# Patient Record
Sex: Male | Born: 1985 | Race: White | Hispanic: No | Marital: Single | Smoking: Former smoker
Health system: Southern US, Community
[De-identification: ages and names within clinical notes are randomized; demographics above are authoritative.]

## PROBLEM LIST (undated history)

## (undated) DIAGNOSIS — I10 Essential (primary) hypertension: Secondary | ICD-10-CM

## (undated) DIAGNOSIS — I509 Heart failure, unspecified: Secondary | ICD-10-CM

## (undated) DIAGNOSIS — R002 Palpitations: Secondary | ICD-10-CM

## (undated) HISTORY — PX: APPENDECTOMY: SHX54

---

## 2021-06-27 ENCOUNTER — Other Ambulatory Visit: Payer: Self-pay

## 2021-06-27 ENCOUNTER — Emergency Department
Admission: EM | Admit: 2021-06-27 | Discharge: 2021-06-27 | Disposition: A | Discharge status: Home / Self Care | Payer: BC Managed Care – PPO | Source: Home / Self Care | Attending: Family Medicine | Admitting: Family Medicine

## 2021-06-27 ENCOUNTER — Emergency Department (INDEPENDENT_AMBULATORY_CARE_PROVIDER_SITE_OTHER): Payer: BC Managed Care – PPO

## 2021-06-27 DIAGNOSIS — M25512 Pain in left shoulder: Secondary | ICD-10-CM | POA: Diagnosis not present

## 2021-06-27 DIAGNOSIS — Y93B3 Activity, free weights: Secondary | ICD-10-CM

## 2021-06-27 DIAGNOSIS — M792 Neuralgia and neuritis, unspecified: Secondary | ICD-10-CM

## 2021-06-27 HISTORY — DX: Essential (primary) hypertension: I10

## 2021-06-27 HISTORY — DX: Palpitations: R00.2

## 2021-06-27 HISTORY — DX: Heart failure, unspecified: I50.9

## 2021-06-27 MED ORDER — CYCLOBENZAPRINE HCL 10 MG PO TABS: 10.0000 mg | ORAL_TABLET | Freq: Every day | ORAL | 0 refills | Status: AC

## 2021-06-27 MED ORDER — PREDNISONE 20 MG PO TABS: ORAL_TABLET | ORAL | 0 refills | Status: AC

## 2021-06-27 NOTE — Discharge Instructions (Addendum)
Apply ice pack for 20 to 30 minutes, 3 to 4 times daily  Continue until pain decreases.  May take 2 extra strength Tylenol tabs once or twice daily if needed for pain. Avoid heavy lifting.

## 2021-06-27 NOTE — ED Provider Notes (Signed)
[name]    CSN: [id number] Arrival date & time: 06/27/21  1451      History   Chief Complaint Chief Complaint  Patient presents with   Back Pain    Between shoulder blades   Numbness    Left arm    HPI Donald Mann is a 35 y.o. male.   Patient states that he was lifting weights in the gym about 1.5 hours ago.  While doing "curls" with his left arm using a 10 pound dumb-bell, he suddenly experienced numbness/tingling in his left arm and pain around his left shoulder and left scapula.  The sensation of tingling has decreased but he still feels weakness in his left arm. One week ago while doing cable curls with his left arm, with his left arm positioned horizontally to his left, he suddenly experienced pain in his left trapezius and left scapular area that resolved.   He denies chest pain, shortness of breath, diaphoresis. Patient recalls that about one year ago while doing "shoulder shrugs" using dumb-bells in both arms, he suddenly felt a "popping" pain in his posterior neck that lasted about 3 weeks before resolving.  The history is provided by the patient.  Back Pain Pain location: upper back. Radiates to:  L shoulder Pain severity:  Moderate Pain is:  Same all the time Onset quality:  Sudden Duration:  2 hours Timing:  Constant Progression:  Improving Chronicity:  Recurrent Context comment:  Lifting weights Relieved by:  Nothing Exacerbated by: left arm movement. Ineffective treatments: Excedrin. Associated symptoms: numbness, paresthesias, tingling and weakness   Associated symptoms: no chest pain and no fever    Past Medical History:  Diagnosis Date   Heart failure (HCC)    Hypertension    Palpitations     There are no problems to display for this patient.   Past Surgical History:  Procedure Laterality Date   APPENDECTOMY         Home Medications    Prior to Admission medications   Medication Sig Start Date End Date Taking?  Authorizing Provider  cyclobenzaprine (FLEXERIL) 10 MG tablet Take 1 tablet (10 mg total) by mouth at bedtime. 06/27/21  Yes Lattie Haw, MD  predniSONE (DELTASONE) 20 MG tablet Take one tab by mouth twice daily for 4 days, then one daily. Take with food. 06/27/21  Yes Lattie Haw, MD  metoprolol tartrate (LOPRESSOR) 25 MG tablet Take by mouth. 01/09/19   [provider]    Family History Family History  Problem Relation Age of Onset   Anemia Mother    Hypotension Mother    Healthy Father     Social History Social History   Tobacco Use   Smoking status: Former    Years: 3.00    Pack years: 0.00    Types: Cigarettes    Quit date: 2021    Years since quitting: 1.5   Smokeless tobacco: Never  Vaping Use   Vaping Use: Never used  Substance Use Topics   Alcohol use: Not Currently   Drug use: Not Currently     Allergies   Patient has no known allergies.   Review of Systems Review of Systems  Constitutional:  Negative for diaphoresis and fever.  Respiratory:  Negative for cough, chest tightness and shortness of breath.   Cardiovascular:  Negative for chest pain and palpitations.  Musculoskeletal:  Positive for back pain and neck pain. Negative for joint swelling and neck stiffness.  Skin:  Negative for  rash.  Neurological:  Positive for tingling, weakness, numbness and paresthesias.  All other systems reviewed and are negative.   Physical Exam Triage Vital Signs ED Triage Vitals  Enc Vitals Group     BP 06/27/21 1503 (!) 137/97     Pulse Rate 06/27/21 1503 88     Resp 06/27/21 1503 20     Temp 06/27/21 1503 98.6 F (37 C)     Temp Source 06/27/21 1503 Oral     SpO2 06/27/21 1503 97 %     Weight 06/27/21 1506 223 lb (101.2 kg)     Height 06/27/21 1506 5\' 8"  (1.727 m)     Head Circumference --      Peak Flow --      Pain Score 06/27/21 1505 2     Pain Loc --      Pain Edu? --      Excl. in GC? --    No data found.  Updated Vital Signs BP (!)  147/77 (BP Location: Right Arm)   Pulse 88   Temp 98.6 F (37 C) (Oral)   Resp 20   Ht 5\' 8"  (1.727 m)   Wt 101.2 kg   SpO2 97%   BMI 33.91 kg/m   Visual Acuity Right Eye Distance:   Left Eye Distance:   Bilateral Distance:    Right Eye Near:   Left Eye Near:    Bilateral Near:     Physical Exam Vitals and nursing note reviewed.  Constitutional:      General: He is not in acute distress. HENT:     Head: Normocephalic.     Mouth/Throat:     Pharynx: Oropharynx is clear.  Eyes:     Conjunctiva/sclera: Conjunctivae normal.     Pupils: Pupils are equal, round, and reactive to light.  Neck:     Comments: When rotating head to the right, and flexing his neck laterally to the right, patient experiences pain in his left trapezius area.  When he flexes his neck forward, he experiences pain in his upper midline back.  There is mild tenderness to palpation over the left trapezius muscle. Cardiovascular:     Rate and Rhythm: Normal rate and regular rhythm.     Heart sounds: Normal heart sounds.  Pulmonary:     Breath sounds: Normal breath sounds.  Abdominal:     Palpations: Abdomen is soft.     Tenderness: There is no abdominal tenderness.  Musculoskeletal:        General: No swelling or deformity.     Left shoulder: No tenderness. Normal range of motion.     Cervical back: Normal range of motion. No tenderness.     Comments: Left shoulder:  negative Apley's test.  There is decreased grip strength left hand and decreased sensation also.  Lymphadenopathy:     Cervical: No cervical adenopathy.  Skin:    General: Skin is warm and dry.     Findings: No rash.  Neurological:     Mental Status: He is alert and oriented to person, place, and time.     UC Treatments / Results  Labs (all labs ordered are listed, but only abnormal results are displayed) Labs Reviewed - No data to display  EKG  Rate:  73 BPM PR:  142 msec  QT:  350 msec QTcH:  385 msec QRSD:  90 msec QRS  axis:  6 degrees Interpretation:  Within normal limits; normal sinus rhythm    Radiology DG  Cervical Spine Complete  Result Date: 06/27/2021 CLINICAL DATA:  Left shoulder and scapular pain while lifting weights. EXAM: CERVICAL SPINE - COMPLETE 4+ VIEW COMPARISON:  Neck CT 02/10/2009, report only. FINDINGS: The lateral view images through the bottom of C7. Prevertebral soft tissues are within normal limits. Mild straightening and reversal of expected cervical lordosis. Maintenance of vertebral body height. Mild left-sided neural foraminal narrowing at C3-4 and C4-5 secondary to facet arthropathy. A Don toy process and lateral masses intact. C7/T1 junction grossly within normal limits. IMPRESSION: Straightening and mild reversal expected cervical lordosis, possibly due to muscular spasm. Mild left-sided neural foraminal narrowing as detailed above. Electronically Signed   By: Jeronimo Greaves M.D.   On: 06/27/2021 17:24    Procedures Procedures (including critical care time)  Medications Ordered in UC Medications - No data to display  Initial Impression / Assessment and Plan / UC Course  I have reviewed the triage vital signs and the nursing notes.  Pertinent labs & imaging results that were available during my care of the patient were reviewed by me and considered in my medical decision making (see chart for details).    Normal EKG reassuring. Begin prednisone burst/taper.  Rx for Flexeril 10mg  HS. Followup with Dr[facility] ([facility] in about one week for further evaluation.  Final Clinical Impressions(s) / UC Diagnoses   Final diagnoses:  Radicular pain in left arm     Discharge Instructions      Apply ice pack for 20 to 30 minutes, 3 to 4 times daily  Continue until pain decreases.  May take 2 extra strength Tylenol tabs once or twice daily if needed for pain. Avoid heavy lifting.     ED Prescriptions     Medication Sig Dispense Auth. Provider    predniSONE (DELTASONE) 20 MG tablet Take one tab by mouth twice daily for 4 days, then one daily. Take with food. 12 tablet Donald Langton, MD   cyclobenzaprine (FLEXERIL) 10 MG tablet Take 1 tablet (10 mg total) by mouth at bedtime. 10 tablet Lattie Haw, MD         Lattie Haw, MD 07/01/21 1053

## 2021-06-27 NOTE — ED Triage Notes (Addendum)
Pt presents to Urgent Care with c/o onset of pain to upper back between shoulder blades and L arm numbness approx 1.5 hours ago while at the gym. Reports he was lifting weights when this occurred, and he had a similar incident of pain in L shoulder blade one week ago while at the gym while lifting weights (but no numbness at this time). Denies chest pain, nausea, sob, radiating pain. L arm is warm and radial pulse palpable. Reports hx of heart failure. Dr. Cathren Harsh aware.

## 2021-07-04 ENCOUNTER — Encounter: Payer: Self-pay | Admitting: Family Medicine

## 2021-07-04 ENCOUNTER — Other Ambulatory Visit: Payer: Self-pay

## 2021-07-04 ENCOUNTER — Ambulatory Visit (INDEPENDENT_AMBULATORY_CARE_PROVIDER_SITE_OTHER): Payer: BC Managed Care – PPO | Admitting: Family Medicine

## 2021-07-04 VITALS — BP 140/106 | Ht 68.0 in | Wt 223.0 lb

## 2021-07-04 DIAGNOSIS — M5412 Radiculopathy, cervical region: Secondary | ICD-10-CM | POA: Diagnosis not present

## 2021-07-04 NOTE — Patient Instructions (Signed)
Nice to meet you Please try heat  Please try the exercises   Please send me a message in MyChart with any questions or updates.  Please see me back in 4 weeks or as needed if better.   --Dr. Fredda Clarida  

## 2021-07-04 NOTE — Progress Notes (Signed)
  Donald Mann - 35 y.o. male MRN [id number]  Date of birth: 11-Dec-1986  SUBJECTIVE:  Including CC & ROS.  No chief complaint on file.   Donald Mann is a 35 y.o. male that is presenting with left arm pain and numbness.  Symptoms been ongoing for about a week.  Does seem to be getting improvement.  Felt he has symptoms initially while he is working out at [employer]  He works as a Curator..  Independent review of the cervical spine x-ray from 7/8 shows no acute changes.   Review of Systems See HPI   HISTORY: Past Medical, Surgical, Social, and Family History Reviewed & Updated per EMR.   Pertinent Historical Findings include:  Past Medical History:  Diagnosis Date   Heart failure (HCC)    Hypertension    Palpitations     Past Surgical History:  Procedure Laterality Date   APPENDECTOMY      Family History  Problem Relation Age of Onset   Anemia Mother    Hypotension Mother    Healthy Father     Social History   Socioeconomic History   Marital status: Divorced    Spouse name: Not on file   Number of children: Not on file   Years of education: Not on file   Highest education level: Not on file  Occupational History   Not on file  Tobacco Use   Smoking status: Former    Years: 3.00    Types: Cigarettes    Quit date: 2021    Years since quitting: 1.5   Smokeless tobacco: Never  Vaping Use   Vaping Use: Never used  Substance and Sexual Activity   Alcohol use: Not Currently   Drug use: Not Currently   Sexual activity: Not on file  Other Topics Concern   Not on file  Social History Narrative   Not on file   Social Determinants of Health   Financial Resource Strain: Not on file  Food Insecurity: Not on file  Transportation Needs: Not on file  Physical Activity: Not on file  Stress: Not on file  Social Connections: Not on file  Intimate Partner Violence: Not on file     PHYSICAL EXAM:  VS: BP (!) 140/106 (BP Location: Right Arm, Patient Position:  Sitting, Cuff Size: Large)   Ht 5\' 8"  (1.727 m)   Wt 223 lb (101.2 kg)   BMI 33.91 kg/m  Physical Exam Gen: NAD, alert, cooperative with exam, well-appearing     ASSESSMENT & PLAN:   Cervical radiculopathy Having altered sensation and pain in the distribution of radicular pain down the left arm.  Has gotten improvement but his strength has not returned to 100%. -Counseled on home exercise therapy and supportive care. -Could consider physical therapy.

## 2021-07-04 NOTE — Assessment & Plan Note (Signed)
Having altered sensation and pain in the distribution of radicular pain down the left arm.  Has gotten improvement but his strength has not returned to 100%. -Counseled on home exercise therapy and supportive care. -Could consider physical therapy.

## 2021-07-28 ENCOUNTER — Other Ambulatory Visit: Payer: Self-pay

## 2021-07-28 ENCOUNTER — Emergency Department (INDEPENDENT_AMBULATORY_CARE_PROVIDER_SITE_OTHER)
Admission: EM | Admit: 2021-07-28 | Discharge: 2021-07-28 | Disposition: A | Discharge status: Home / Self Care | Payer: BC Managed Care – PPO | Source: Home / Self Care

## 2021-07-28 DIAGNOSIS — L739 Follicular disorder, unspecified: Secondary | ICD-10-CM | POA: Diagnosis not present

## 2021-07-28 HISTORY — DX: Heart failure, unspecified: I50.9

## 2021-07-28 MED ORDER — SULFAMETHOXAZOLE-TRIMETHOPRIM 800-160 MG PO TABS: 1.0000 | ORAL_TABLET | Freq: Two times a day (BID) | ORAL | 0 refills | Status: AC

## 2021-07-28 NOTE — ED Provider Notes (Signed)
[name]    CSN: [id number] Arrival date & time: 07/28/21  1155      History   Chief Complaint Chief Complaint  Patient presents with   Abscess    Waistline      HPI Donald Mann is a 35 y.o. male.   HPI Pleasant 35 year old male presents with possible abscess of waistline.  Patient reports red and painful bump with hole in top of it with bloody to clear discharge since yesterday.  Past Medical History:  Diagnosis Date   CHF (congestive heart failure) (HCC)    Heart failure (HCC)    Hypertension    Palpitations     Patient Active Problem List   Diagnosis Date Noted   Cervical radiculopathy 07/04/2021    Past Surgical History:  Procedure Laterality Date   APPENDECTOMY         Home Medications    Prior to Admission medications   Medication Sig Start Date End Date Taking? Authorizing Provider  sulfamethoxazole-trimethoprim (BACTRIM DS) 800-160 MG tablet Take 1 tablet by mouth 2 (two) times daily for 7 days. 07/28/21 08/04/21 Yes Trevor Iha, FNP  cyclobenzaprine (FLEXERIL) 10 MG tablet Take 1 tablet (10 mg total) by mouth at bedtime. 06/27/21   Lattie Haw, MD  metoprolol tartrate (LOPRESSOR) 25 MG tablet Take by mouth. 01/09/19   [provider]  predniSONE (DELTASONE) 20 MG tablet Take one tab by mouth twice daily for 4 days, then one daily. Take with food. 06/27/21   Lattie Haw, MD    Family History Family History  Problem Relation Age of Onset   Anemia Mother    Hypotension Mother    Healthy Father     Social History Social History   Tobacco Use   Smoking status: Former    Years: 3.00    Types: Cigarettes    Quit date: 2021    Years since quitting: 1.6   Smokeless tobacco: Never  Vaping Use   Vaping Use: Never used  Substance Use Topics   Alcohol use: Not Currently   Drug use: Not Currently     Allergies   Patient has no known allergies.   Review of Systems Review of Systems  Skin:  Positive for  rash.  All other systems reviewed and are negative.   Physical Exam Triage Vital Signs ED Triage Vitals  Enc Vitals Group     BP 07/28/21 1220 134/84     Pulse Rate 07/28/21 1220 95     Resp 07/28/21 1220 16     Temp 07/28/21 1220 98.4 F (36.9 C)     Temp Source 07/28/21 1220 Oral     SpO2 07/28/21 1220 98 %     Weight 07/28/21 1216 220 lb (99.8 kg)     Height 07/28/21 1216 5\' 8"  (1.727 m)     Head Circumference --      Peak Flow --      Pain Score 07/28/21 1216 0     Pain Loc --      Pain Edu? --      Excl. in GC? --    No data found.  Updated Vital Signs BP 134/84 (BP Location: Left Arm)   Pulse 95   Temp 98.4 F (36.9 C) (Oral)   Resp 16   Ht 5\' 8"  (1.727 m)   Wt 220 lb (99.8 kg)   SpO2 98%   BMI 33.45 kg/m   Physical Exam Vitals and nursing note reviewed.  Constitutional:  General: He is not in acute distress.    Appearance: Normal appearance. He is normal weight. He is not ill-appearing.  HENT:     Head: Normocephalic and atraumatic.     Mouth/Throat:     Mouth: Mucous membranes are moist.     Pharynx: Oropharynx is clear.  Eyes:     Extraocular Movements: Extraocular movements intact.     Conjunctiva/sclera: Conjunctivae normal.     Pupils: Pupils are equal, round, and reactive to light.  Cardiovascular:     Rate and Rhythm: Normal rate and regular rhythm.     Pulses: Normal pulses.     Heart sounds: Normal heart sounds.  Pulmonary:     Effort: Pulmonary effort is normal. No respiratory distress.     Breath sounds: Normal breath sounds. No stridor. No wheezing, rhonchi or rales.  Musculoskeletal:        General: Normal range of motion.     Cervical back: Normal range of motion and neck supple. No tenderness.  Lymphadenopathy:     Cervical: No cervical adenopathy.  Skin:    General: Skin is warm and dry.     Comments: Central inferior superior pubic area: ~2.0 cm x 2.0 cm circular shaped area of erythema with central pustular lesion,  indurated, fluctuant, no lymphatic streaking noted, no discharge/drainage  Neurological:     General: No focal deficit present.     Mental Status: He is alert and oriented to person, place, and time. Mental status is at baseline.  Psychiatric:        Mood and Affect: Mood normal.        Behavior: Behavior normal.        Thought Content: Thought content normal.     UC Treatments / Results  Labs (all labs ordered are listed, but only abnormal results are displayed) Labs Reviewed - No data to display  EKG   Radiology No results found.  Procedures Procedures (including critical care time)  Medications Ordered in UC Medications - No data to display  Initial Impression / Assessment and Plan / UC Course  I have reviewed the triage vital signs and the nursing notes.  Pertinent labs & imaging results that were available during my care of the patient were reviewed by me and considered in my medical decision making (see chart for details).    MDM: 1.  Folliculitis-Rx'd Bactrim. Advised/instructed patient to take medication as directed with food to completion.  Encouraged patient increase daily water intake while taking this medication.  Final Clinical Impressions(s) / UC Diagnoses   Final diagnoses:  Folliculitis     Discharge Instructions      Advised/instructed patient to take medication as directed with food to completion.  Encouraged patient increase daily water intake while taking this medication.     ED Prescriptions     Medication Sig Dispense Auth. Provider   sulfamethoxazole-trimethoprim (BACTRIM DS) 800-160 MG tablet Take 1 tablet by mouth 2 (two) times daily for 7 days. 14 tablet Trevor Iha, FNP      PDMP not reviewed this encounter.   Trevor Iha, FNP 07/28/21 1251

## 2021-07-28 NOTE — ED Triage Notes (Signed)
Pt st that last night he noticed that he had a bump on waist line that was red and painful with a hole on top of it  with bloody and clear discharge. Pt put bandage on it for a few hours and it soaked with discharge.

## 2021-07-28 NOTE — Discharge Instructions (Addendum)
Advised/instructed patient to take medication as directed with food to completion.  Encouraged patient increase daily water intake while taking this medication. 

## 2021-08-07 ENCOUNTER — Ambulatory Visit: Payer: BC Managed Care – PPO | Admitting: Family Medicine

## 2021-08-07 NOTE — Progress Notes (Deleted)
  Donald Mann - 35 y.o. male MRN [id number]  Date of birth: 1986-02-11  SUBJECTIVE:  Including CC & ROS.  No chief complaint on file.   Donald Mann is a 35 y.o. male that is  ***.  ***   Review of Systems See HPI   HISTORY: Past Medical, Surgical, Social, and Family History Reviewed & Updated per EMR.   Pertinent Historical Findings include:  Past Medical History:  Diagnosis Date   CHF (congestive heart failure) (HCC)    Heart failure (HCC)    Hypertension    Palpitations     Past Surgical History:  Procedure Laterality Date   APPENDECTOMY      Family History  Problem Relation Age of Onset   Anemia Mother    Hypotension Mother    Healthy Father     Social History   Socioeconomic History   Marital status: Single    Spouse name: Not on file   Number of children: Not on file   Years of education: Not on file   Highest education level: Not on file  Occupational History   Not on file  Tobacco Use   Smoking status: Former    Years: 3.00    Types: Cigarettes    Quit date: 2021    Years since quitting: 1.6   Smokeless tobacco: Never  Vaping Use   Vaping Use: Never used  Substance and Sexual Activity   Alcohol use: Not Currently   Drug use: Not Currently   Sexual activity: Not on file  Other Topics Concern   Not on file  Social History Narrative   Not on file   Social Determinants of Health   Financial Resource Strain: Not on file  Food Insecurity: Not on file  Transportation Needs: Not on file  Physical Activity: Not on file  Stress: Not on file  Social Connections: Not on file  Intimate Partner Violence: Not on file     PHYSICAL EXAM:  VS: There were no vitals taken for this visit. Physical Exam Gen: NAD, alert, cooperative with exam, well-appearing MSK:  ***      ASSESSMENT & PLAN:   No problem-specific Assessment & Plan notes found for this encounter.

## 2023-04-07 ENCOUNTER — Encounter: Payer: Self-pay | Admitting: *Deleted

## 2023-04-07 IMAGING — DX DG CERVICAL SPINE COMPLETE 4+V
6 series · 6 of 6 positions shown · non-contrast
Comparison: Neck CT 02/10/2009, report only.

CLINICAL DATA: Left shoulder and scapular pain while lifting
weights.

EXAM:
CERVICAL SPINE - COMPLETE 4+ VIEW

[c-spine lat]
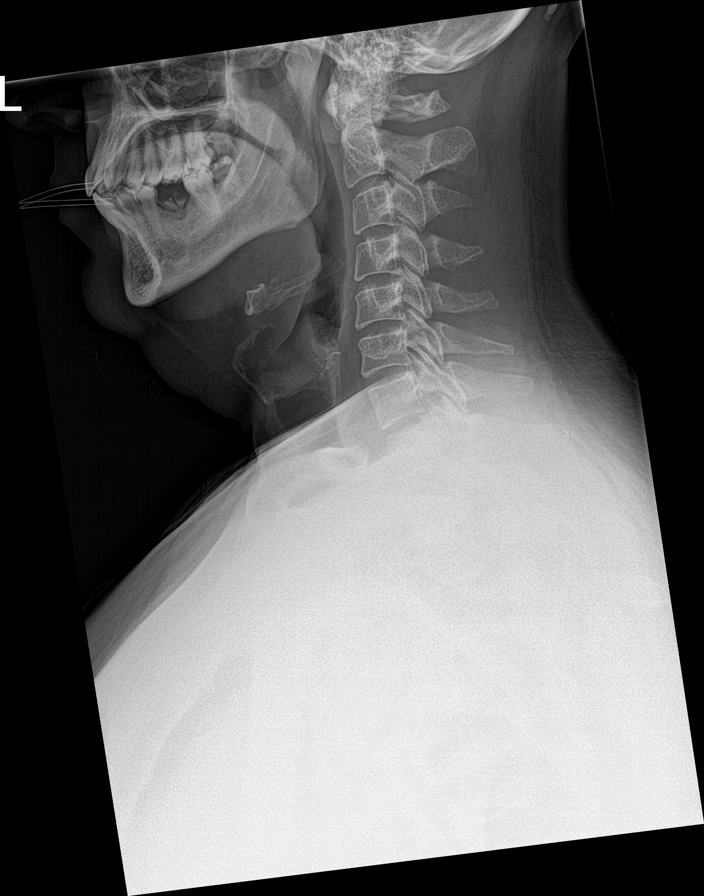

[c-spine obl (1 of 2)]
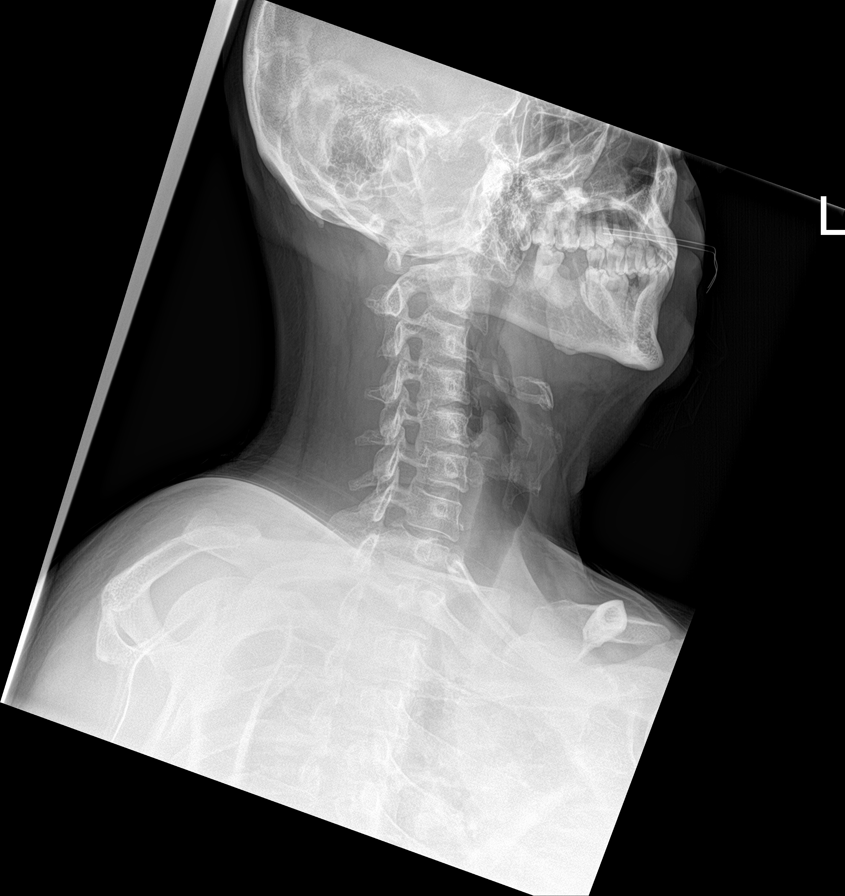

[c-spine obl (2 of 2)]
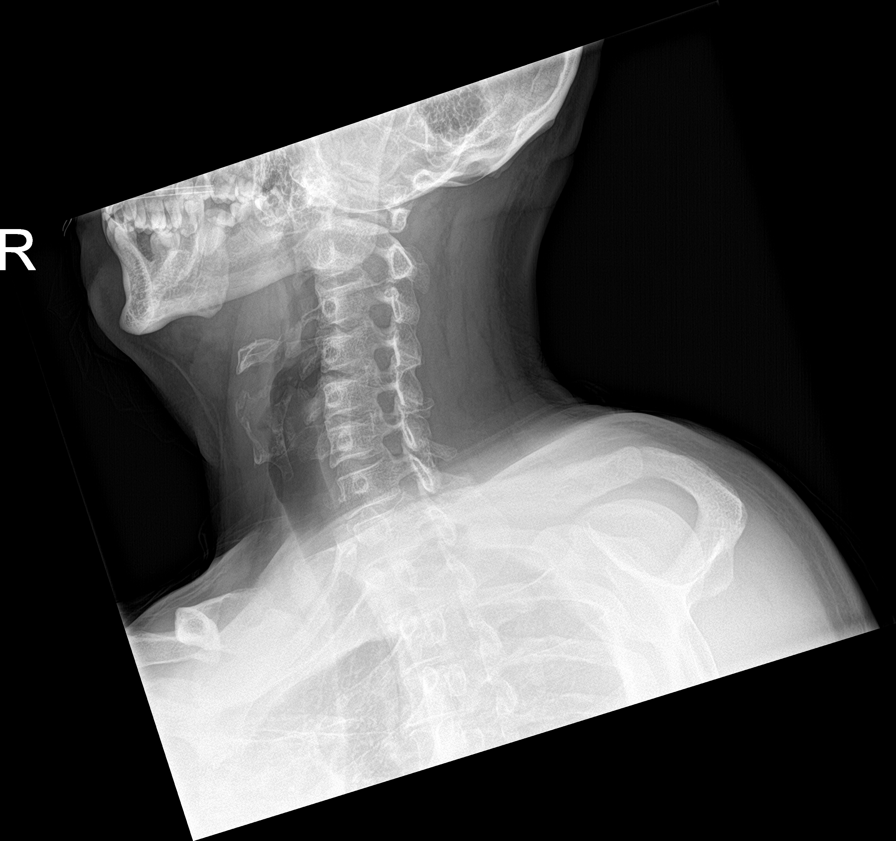

[c-spine ap]
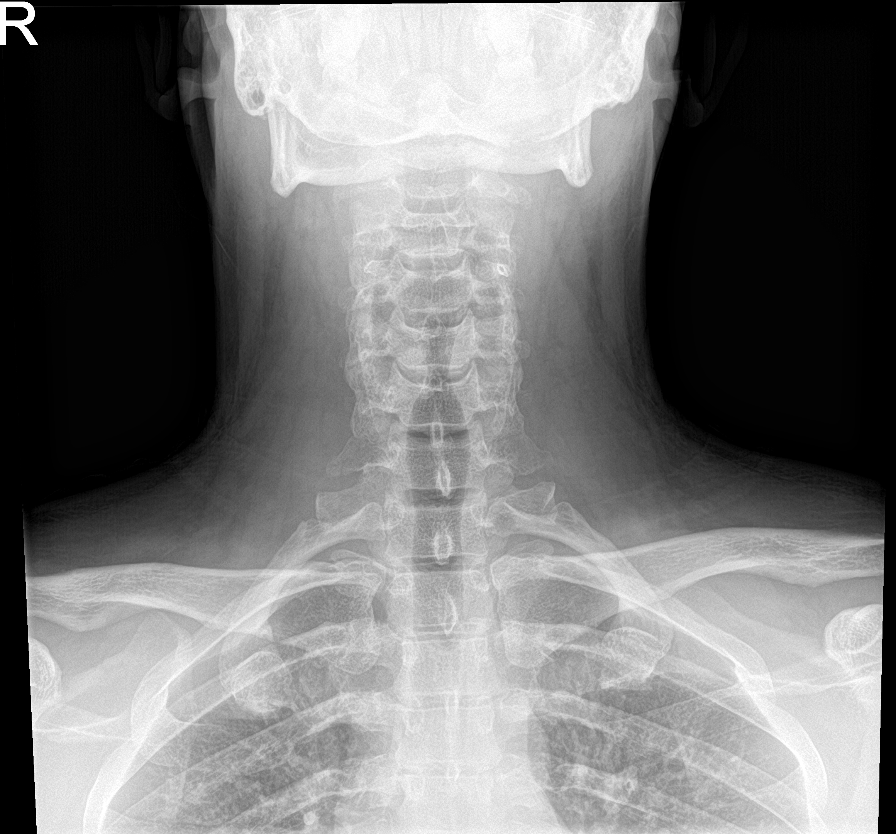

[c-spine open mouth]
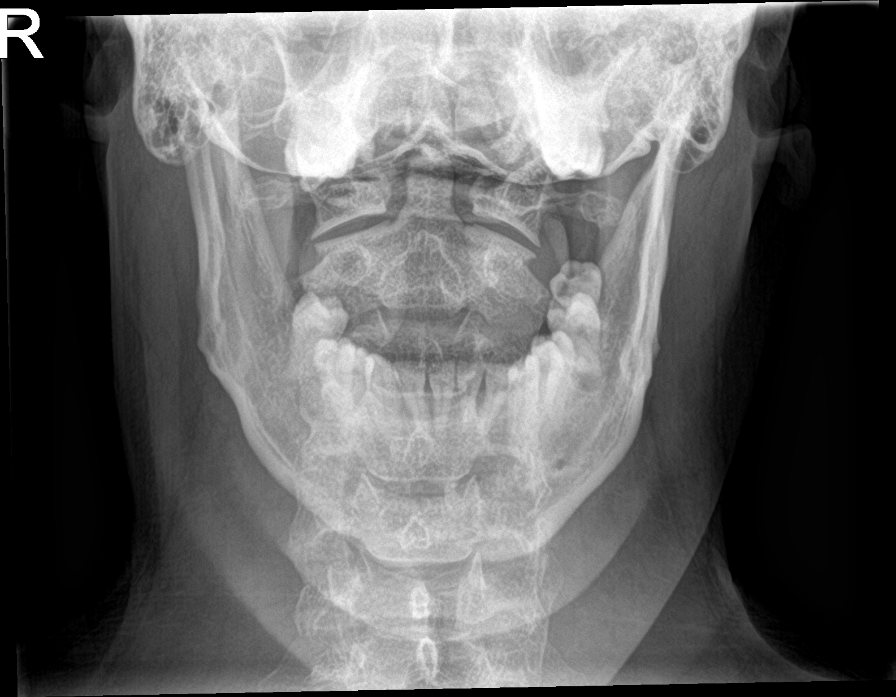

[c-spine swimmers]
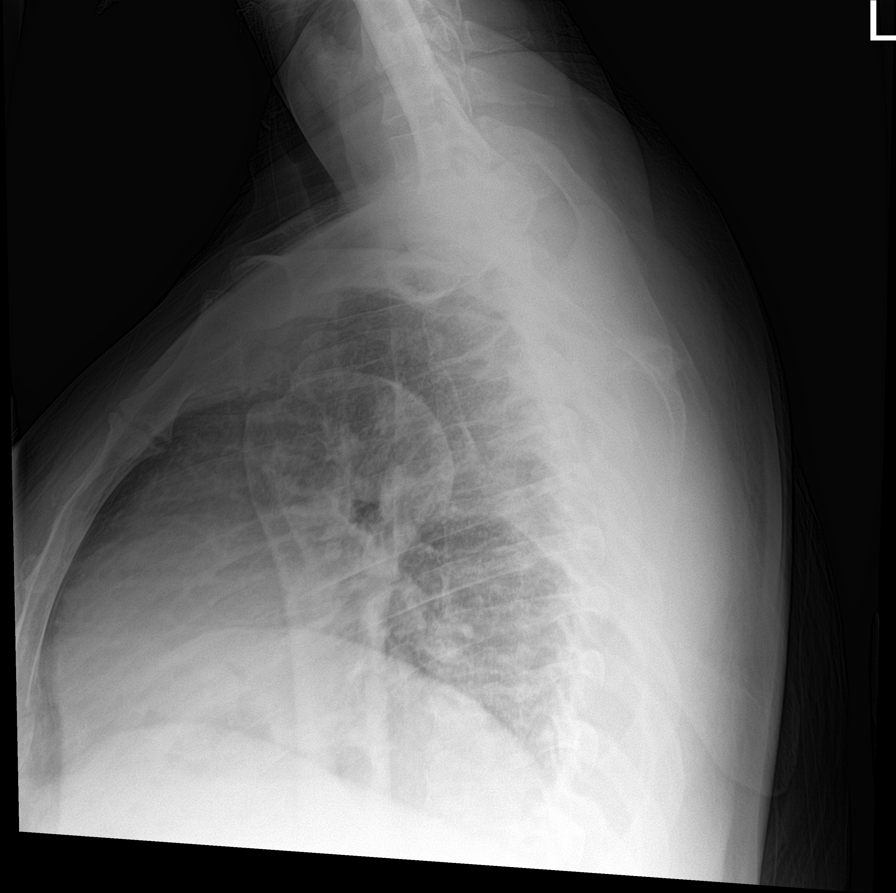

[6 of 6 positions shown; findings below may reference images not displayed]

FINDINGS: The lateral view images through the bottom of C7. Prevertebral soft
tissues are within normal limits. Mild straightening and reversal of
expected cervical lordosis. Maintenance of vertebral body height.
Mild left-sided neural foraminal narrowing at C3-4 and C4-5
secondary to facet arthropathy. Gunther Zarate process and lateral masses
intact. C7/T1 junction grossly within normal limits.
IMPRESSION: Straightening and mild reversal expected cervical lordosis, possibly
due to muscular spasm.

Mild left-sided neural foraminal narrowing as detailed above.
